# Patient Record
Sex: Female | Born: 1950 | Hispanic: Yes | Smoking: Never smoker
Health system: Southern US, Community
[De-identification: ages and names within clinical notes are randomized; demographics above are authoritative.]

## PROBLEM LIST (undated history)

## (undated) DIAGNOSIS — I10 Essential (primary) hypertension: Secondary | ICD-10-CM

---

## 2018-03-18 ENCOUNTER — Encounter: Payer: Self-pay | Admitting: Emergency Medicine

## 2018-03-18 ENCOUNTER — Emergency Department
Admission: EM | Admit: 2018-03-18 | Discharge: 2018-03-18 | Disposition: A | Discharge status: Home / Self Care | Payer: Self-pay | Attending: Emergency Medicine | Admitting: Emergency Medicine

## 2018-03-18 ENCOUNTER — Other Ambulatory Visit: Payer: Self-pay

## 2018-03-18 DIAGNOSIS — H73892 Other specified disorders of tympanic membrane, left ear: Secondary | ICD-10-CM | POA: Insufficient documentation

## 2018-03-18 DIAGNOSIS — Z79899 Other long term (current) drug therapy: Secondary | ICD-10-CM | POA: Insufficient documentation

## 2018-03-18 DIAGNOSIS — R040 Epistaxis: Secondary | ICD-10-CM

## 2018-03-18 DIAGNOSIS — H6592 Unspecified nonsuppurative otitis media, left ear: Secondary | ICD-10-CM

## 2018-03-18 DIAGNOSIS — I10 Essential (primary) hypertension: Secondary | ICD-10-CM | POA: Insufficient documentation

## 2018-03-18 HISTORY — DX: Essential (primary) hypertension: I10

## 2018-03-18 MED ORDER — CETIRIZINE HCL 10 MG PO CAPS: 10.0000 mg | ORAL_CAPSULE | Freq: Every day | ORAL | 1 refills | Status: AC

## 2018-03-18 NOTE — ED Provider Notes (Signed)
[facility] Emergency Department Provider Note  ____________________________________________  Time seen: Approximately 3:36 PM  I have reviewed the triage vital signs and the nursing notes.   HISTORY  Chief Complaint Epistaxis    HPI Sandra George is a 67 y.o. female presents to the emergency department with a resolved episode of epistaxis that lasted approximately 20 minutes.  Patient had epistaxis from left nare only.  No intranasal medications have been used recently.  Patient denies recreational drug use.  She denies any known history of seasonal allergies but has had left ear discomfort.  Patient reports that she had intermittent episodes of epistaxis as a child but no history as an adult.  Patient denies headache and history of bleeding disorders.  No alleviating measures of been attempted.   Past Medical History:  Diagnosis Date  . Hypertension     There are no active problems to display for this patient.   History reviewed. No pertinent surgical history.  Prior to Admission medications   Medication Sig Start Date End Date Taking? Authorizing Provider  enalapril (VASOTEC) 10 MG tablet Take 10 mg by mouth daily.   Yes [provider]  Cetirizine HCl (ZYRTEC ALLERGY) 10 MG CAPS Take 1 capsule (10 mg total) by mouth daily for 7 doses. 03/18/18 03/25/18  Orvil FeilWoods, Jaclyn M, PA-C    Allergies Patient has no known allergies.  No family history on file.  Social History Social History   Tobacco Use  . Smoking status: Not on file  Substance Use Topics  . Alcohol use: Not on file  . Drug use: Not on file     Review of Systems  Constitutional: No fever/chills Eyes: No visual changes. No discharge ENT: Patient has epistaxis. Patient has left ear discomfort.  Cardiovascular: no chest pain. Respiratory: no cough. No SOB. Gastrointestinal: No abdominal pain.  No nausea, no vomiting.  No diarrhea.  No constipation. Genitourinary: Negative for  dysuria. No hematuria Musculoskeletal: Negative for musculoskeletal pain. Skin: Negative for rash, abrasions, lacerations, ecchymosis. Neurological: Negative for headaches, focal weakness or numbness.   ___________________________________   PHYSICAL EXAM:  VITAL SIGNS: ED Triage Vitals  Enc Vitals Group     BP 03/18/18 1347 (!) 176/92     Pulse Rate 03/18/18 1347 96     Resp 03/18/18 1347 20     Temp 03/18/18 1347 98.4 F (36.9 C)     Temp Source 03/18/18 1347 Oral     SpO2 03/18/18 1347 98 %     Weight 03/18/18 1348 140 lb (63.5 kg)     Height 03/18/18 1348 5' (1.524 m)     Head Circumference --      Peak Flow --      Pain Score 03/18/18 1347 0     Pain Loc --      Pain Edu? --      Excl. in GC? --      Constitutional: Alert and oriented. Well appearing and in no acute distress. Eyes: Conjunctivae are normal. PERRL. EOMI. Head: Atraumatic. ENT:      Ears: Left TM is bulging and effused.  Right TM is mildly effused.      Nose: No congestion/rhinnorhea.  Trace blood at left nare.  Epistaxis is resolved.      Mouth/Throat: Mucous membranes are moist.  Neck: No stridor.  No cervical spine tenderness to palpation. Hematological/Lymphatic/Immunilogical: No cervical lymphadenopathy. Cardiovascular: Normal rate, regular rhythm. Normal S1 and S2.  Good peripheral circulation. Respiratory: Normal respiratory effort  without tachypnea or retractions. Lungs CTAB. Good air entry to the bases with no decreased or absent breath sounds. Skin:  Skin is warm, dry and intact. No rash noted.  ____________________________________________   LABS (all labs ordered are listed, but only abnormal results are displayed)  Labs Reviewed - No data to display ____________________________________________  EKG   ____________________________________________  RADIOLOGY   No results found.  ____________________________________________    PROCEDURES  Procedure(s) performed:     Procedures    Medications - No data to display   ____________________________________________   INITIAL IMPRESSION / ASSESSMENT AND PLAN / ED COURSE  Pertinent labs & imaging results that were available during my care of the patient were reviewed by me and considered in my medical decision making (see chart for details).  Review of the [place] CSRS was performed in accordance of the [facility] prior to dispensing any controlled drugs.      Assessment and plan Epistaxis Middle ear effusion Patient presents to the emergency department with an episode of epistaxis that lasted approximately 20 minutes and spontaneously resolved.  Patient does not have a history of epistaxis and further work-up is not warranted at this time.  Patient was advised to follow-up with primary care regarding hypertension noted at triage.  Patient was discharged with Zyrtec for her left middle ear effusion.  All patient questions were answered.     ____________________________________________  FINAL CLINICAL IMPRESSION(S) / ED DIAGNOSES  Final diagnoses:  Epistaxis  Fluid level behind tympanic membrane of left ear      NEW MEDICATIONS STARTED DURING THIS VISIT:  ED Discharge Orders         Ordered    Cetirizine HCl (ZYRTEC ALLERGY) 10 MG CAPS  Daily     03/18/18 1534              This chart was dictated using voice recognition software/Dragon. Despite best efforts to proofread, errors can occur which can change the meaning. Any change was purely unintentional.    Orvil Feil, PA-C 03/18/18 1543    Sharyn Creamer, MD 03/18/18 [phone]

## 2018-03-18 NOTE — ED Notes (Signed)
Interpretor on ipad used to discuss discharge paperwork.

## 2018-03-18 NOTE — ED Triage Notes (Signed)
Nosebleed for approx prior to arrival. Denies injury. Holding washcloth over nose on arrival. Patient allowed to quit holding nose while in triage. No bleeding noted currently.

## 2018-03-18 NOTE — ED Notes (Signed)
See triage note  Developed nosebleed about 30 mins PTA w/o injury

## 2018-03-18 NOTE — Progress Notes (Signed)
Chaplain responded to a medical emergency at  the visitors entrance. Pt son was with Pt. Chaplain escorted son with Pt to triage. Chaplain let son know chaplain will be available if needed    03/18/18 1300  Clinical Encounter Type  Visited With Patient and family together  Visit Type Initial  Referral From Nurse  Spiritual Encounters  Spiritual Needs Emotional

## 2019-06-18 ENCOUNTER — Other Ambulatory Visit: Payer: Self-pay

## 2019-06-18 ENCOUNTER — Encounter: Payer: Self-pay | Admitting: Emergency Medicine

## 2019-06-18 ENCOUNTER — Emergency Department: Payer: Self-pay

## 2019-06-18 ENCOUNTER — Emergency Department
Admission: EM | Admit: 2019-06-18 | Discharge: 2019-06-19 | Disposition: A | Discharge status: Home / Self Care | Payer: Self-pay | Attending: Emergency Medicine | Admitting: Emergency Medicine

## 2019-06-18 DIAGNOSIS — R42 Dizziness and giddiness: Secondary | ICD-10-CM | POA: Insufficient documentation

## 2019-06-18 DIAGNOSIS — I10 Essential (primary) hypertension: Secondary | ICD-10-CM | POA: Insufficient documentation

## 2019-06-18 DIAGNOSIS — Z79899 Other long term (current) drug therapy: Secondary | ICD-10-CM | POA: Insufficient documentation

## 2019-06-18 LAB — URINALYSIS, COMPLETE (UACMP) WITH MICROSCOPIC
Bacteria, UA: NONE SEEN
Bilirubin Urine: NEGATIVE
Glucose, UA: NEGATIVE mg/dL
Hgb urine dipstick: NEGATIVE
Ketones, ur: NEGATIVE mg/dL
Nitrite: NEGATIVE
Protein, ur: NEGATIVE mg/dL
Specific Gravity, Urine: 1.012 (ref 1.005–1.030)
pH: 5 (ref 5.0–8.0)

## 2019-06-18 LAB — CBC
HCT: 39.7 % (ref 36.0–46.0)
Hemoglobin: 14.1 g/dL (ref 12.0–15.0)
MCH: 30.9 pg (ref 26.0–34.0)
MCHC: 35.5 g/dL (ref 30.0–36.0)
MCV: 86.9 fL (ref 80.0–100.0)
Platelets: 125 10*3/uL — ABNORMAL LOW (ref 150–400)
RBC: 4.57 MIL/uL (ref 3.87–5.11)
RDW: 12.1 % (ref 11.5–15.5)
WBC: 7.3 10*3/uL (ref 4.0–10.5)
nRBC: 0 % (ref 0.0–0.2)

## 2019-06-18 LAB — BASIC METABOLIC PANEL
Anion gap: 15 (ref 5–15)
BUN: 20 mg/dL (ref 8–23)
CO2: 22 mmol/L (ref 22–32)
Calcium: 9.7 mg/dL (ref 8.9–10.3)
Chloride: 102 mmol/L (ref 98–111)
Creatinine, Ser: 1.38 mg/dL — ABNORMAL HIGH (ref 0.44–1.00)
GFR calc Af Amer: 46 mL/min — ABNORMAL LOW (ref >60)
GFR calc non Af Amer: 40 mL/min — ABNORMAL LOW (ref >60)
Glucose, Bld: 155 mg/dL — ABNORMAL HIGH (ref 70–99)
Potassium: 3 mmol/L — ABNORMAL LOW (ref 3.5–5.1)
Sodium: 139 mmol/L (ref 135–145)

## 2019-06-18 LAB — TROPONIN I (HIGH SENSITIVITY): Troponin I (High Sensitivity): 3 ng/L (ref <18)

## 2019-06-18 MED ORDER — POTASSIUM CHLORIDE CRYS ER 20 MEQ PO TBCR
40.0000 meq | EXTENDED_RELEASE_TABLET | Freq: Once | ORAL | Status: AC
  Administered 2019-06-19: 40 meq via ORAL
  Filled 2019-06-18: qty 2

## 2019-06-18 MED ORDER — MECLIZINE HCL 25 MG PO TABS
25.0000 mg | ORAL_TABLET | Freq: Once | ORAL | Status: AC
  Administered 2019-06-19: 25 mg via ORAL
  Filled 2019-06-18: qty 1

## 2019-06-18 NOTE — ED Triage Notes (Signed)
Pt to ED via POV stating that she has been having an issue with her blood pressure running high. Pt states that she has also had headache, dizziness, and nausea. Pt states that her PCP recently changed her blood pressure medication and also started her on a muscle relaxant. Pt is in NAD at this time.

## 2019-06-18 NOTE — ED Provider Notes (Signed)
[facility] Emergency Department Provider Note   ____________________________________________   I have reviewed the triage vital signs and the nursing notes.   HISTORY  Chief Complaint Hypertension and Dizziness   History limited by: Not Limited   HPI Sandra George is a 68 y.o. female who presents to the emergency department today because of concerns for headache high blood pressure and dizziness.  The patient has been having issues with blood pressure control and has seen her primary care physician for this.  She did have her medication switched a few days ago.  Since the switch her blood pressure has continued to be elevated.  This has been associated with some headache.  She does states she has had a headache for about a year that will come and go.  Is located primarily in the right side in the back of her head.  For the past couple of days the headache has been slightly worse and has been accompanied by dizziness.  She does feel the dizziness worse when she moves. The patient denies similar dizziness in the past.    Records reviewed. Per medical record review patient has a history of hypertension.   Past Medical History:  Diagnosis Date  . Hypertension     There are no problems to display for this patient.   History reviewed. No pertinent surgical history.  Prior to Admission medications   Medication Sig Start Date End Date Taking? Authorizing Provider  Cetirizine HCl (ZYRTEC ALLERGY) 10 MG CAPS Take 1 capsule (10 mg total) by mouth daily for 7 doses. 03/18/18 03/25/18  Lannie Fields, PA-C  enalapril (VASOTEC) 10 MG tablet Take 10 mg by mouth daily.    [provider]    Allergies Patient has no known allergies.  No family history on file.  Social History Social History   Tobacco Use  . Smoking status: Never Smoker  . Smokeless tobacco: Never Used  Substance Use Topics  . Alcohol use: Not Currently  . Drug use: Not Currently     Review of Systems Constitutional: No fever/chills Eyes: No visual changes. ENT: No sore throat. Cardiovascular: Denies chest pain. Respiratory: Denies shortness of breath. Gastrointestinal: No abdominal pain.  No nausea, no vomiting.  No diarrhea.   Genitourinary: Negative for dysuria. Musculoskeletal: Negative for back pain. Skin: Negative for rash. Neurological: Positive for headache and dizziness.  ____________________________________________   PHYSICAL EXAM:  VITAL SIGNS: ED Triage Vitals  Enc Vitals Group     BP 06/18/19 1818 (!) 160/99     Pulse Rate 06/18/19 1818 (!) 101     Resp 06/18/19 1818 16     Temp 06/18/19 1818 98.3 F (36.8 C)     Temp Source 06/18/19 1818 Oral     SpO2 06/18/19 1818 100 %     Weight 06/18/19 1820 145 lb (65.8 kg)     Height 06/18/19 1820 5' (1.524 m)     Head Circumference --      Peak Flow --      Pain Score 06/18/19 1818 3   Constitutional: Alert and oriented.  Eyes: Conjunctivae are normal.  ENT      Head: Normocephalic and atraumatic.      Nose: No congestion/rhinnorhea.      Mouth/Throat: Mucous membranes are moist.      Neck: No stridor. Hematological/Lymphatic/Immunilogical: No cervical lymphadenopathy. Cardiovascular: Normal rate, regular rhythm.  No murmurs, rubs, or gallops.  Respiratory: Normal respiratory effort without tachypnea nor retractions. Breath sounds are  clear and equal bilaterally. No wheezes/rales/rhonchi. Gastrointestinal: Soft and non tender. No rebound. No guarding.  Genitourinary: Deferred Musculoskeletal: Normal range of motion in all extremities. No lower extremity edema. Neurologic:  Normal speech and language. No gross focal neurologic deficits are appreciated. Dizziness elicited by sitting the patient up suddenly.  Skin:  Skin is warm, dry and intact. No rash noted. Psychiatric: Mood and affect are normal. Speech and behavior are normal. Patient exhibits appropriate insight and  judgment.  ____________________________________________    LABS (pertinent positives/negatives)  Trop hs 3 BMP wnl except k 3.0, glu 155, cr 1.38 CBC wbc 7.3, hgb 14.1, plt 125  ____________________________________________   EKG  I, Phineas Semen, attending physician, personally viewed and interpreted this EKG  EKG Time: 1824 Rate: 99 Rhythm: normal sinus rhythm Axis: left axis deviation Intervals: qtc 472 QRS: narrow, q waves III ST changes: no st elevation Impression: abnormal ekg  ____________________________________________    RADIOLOGY  CT head Chronic changes without acute abnormality  ____________________________________________   PROCEDURES  Procedures  ____________________________________________   INITIAL IMPRESSION / ASSESSMENT AND PLAN / ED COURSE  Pertinent labs & imaging results that were available during my care of the patient were reviewed by me and considered in my medical decision making (see chart for details).   Patient presented to the emergency department today because of concerns for some headache, high blood pressure and dizziness.  Head CT without any acute abnormality.  Patient's dizziness did seem to be somewhat positional.  I do wonder if patient is suffering from vertigo. Will try meclizine. Patient's potassium was also slightly low so will give potassium here in the emergency department.   ____________________________________________   FINAL CLINICAL IMPRESSION(S) / ED DIAGNOSES  Final diagnoses:  Vertigo  Hypertension, unspecified type     Note: This dictation was prepared with Dragon dictation. Any transcriptional errors that result from this process are unintentional     Phineas Semen, MD 06/19/19 1520

## 2019-06-19 LAB — TROPONIN I (HIGH SENSITIVITY): Troponin I (High Sensitivity): 4 ng/L (ref <18)

## 2019-06-19 MED ORDER — SODIUM CHLORIDE 0.9 % IV BOLUS
500.0000 mL | Freq: Once | INTRAVENOUS | Status: AC
  Administered 2019-06-19: 500 mL via INTRAVENOUS

## 2019-06-19 MED ORDER — SODIUM CHLORIDE 0.9 % IV BOLUS: 1000.0000 mL | Freq: Once | INTRAVENOUS | Status: DC

## 2019-06-19 MED ORDER — MECLIZINE HCL 25 MG PO TABS: 25.0000 mg | ORAL_TABLET | Freq: Three times a day (TID) | ORAL | 0 refills | Status: AC | PRN

## 2019-06-19 NOTE — ED Notes (Signed)
Peripheral IV discontinued. Catheter intact. No signs of infiltration or redness. Gauze applied to IV site.    Discharge instructions reviewed with patient. Questions fielded by this RN. Patient verbalizes understanding of instructions. Patient discharged home in stable condition per [facility]man. No acute distress noted at time of discharge.    

## 2019-06-19 NOTE — ED Notes (Signed)
With interpreter, [place], [place], pt with daughter reports mechanical fall yesterday and has been feeling badly since, pt takes 2 x HTN meds and new med recently added, dizziness and malaise.  Pt appears calm and relaxed, no pain att and CMS intact to extremities, A & O x 4

## 2019-06-20 LAB — URINE CULTURE

## 2020-11-09 IMAGING — CT CT HEAD W/O CM
3 series · 16 of 47 positions shown, 19 images · non-contrast
Comparison: None.

CLINICAL DATA: Hypertension and headaches

EXAM:
CT HEAD WITHOUT CONTRAST
TECHNIQUE: Contiguous axial images were obtained from the base of the skull
through the vertex without intravenous contrast.

[Series 2: head wo · axial · 0.41mm/px · z∈[+284,+409]mm · 10 of 30 slices shown, 13 images]
[im 3/30  brain]
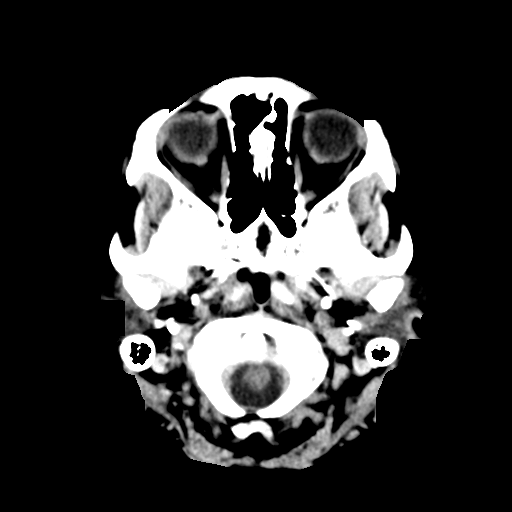
[im 3/30  bone]
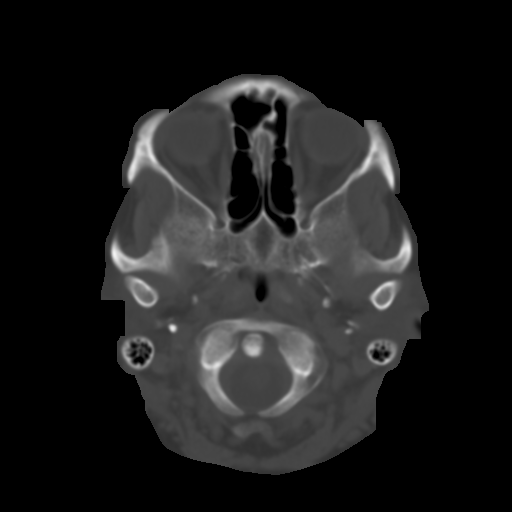
[im 6/30  brain]
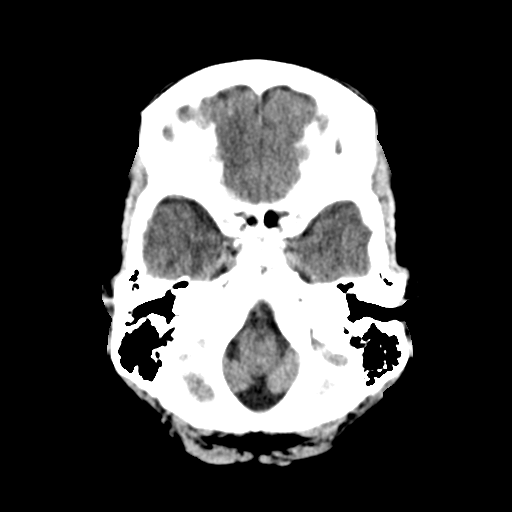
[im 9/30  brain]
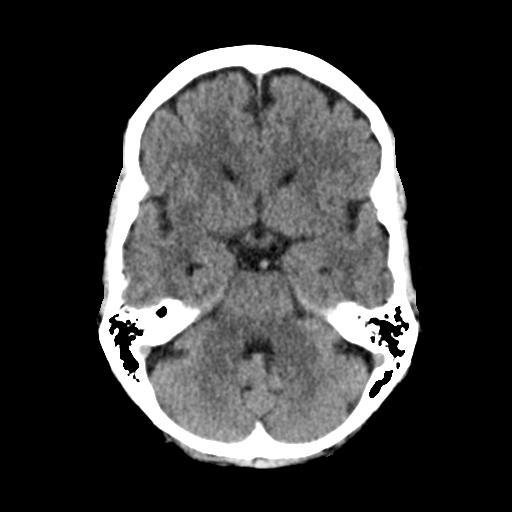
[im 11/30  brain]
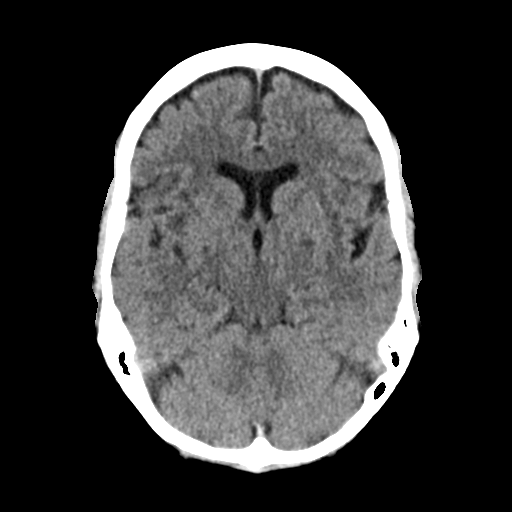
[im 14/30  brain]
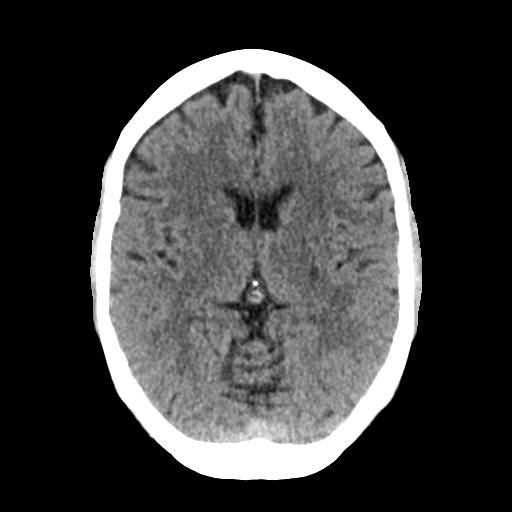
[im 14/30  bone]
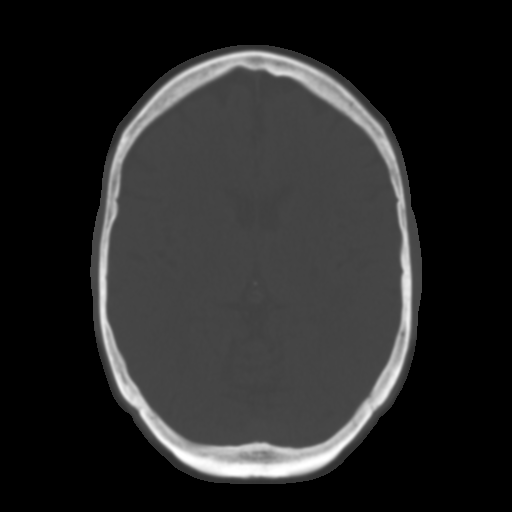
[im 17/30  brain]
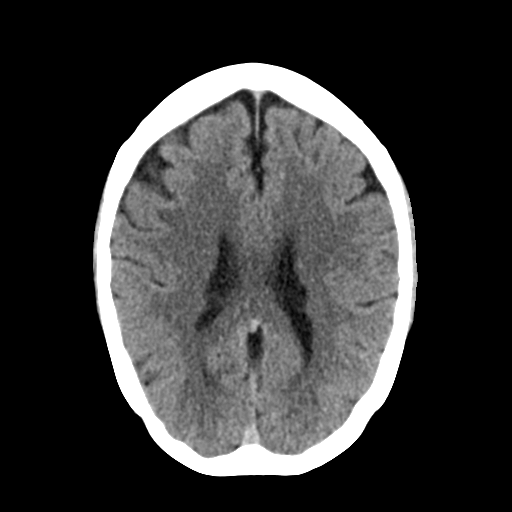
[im 20/30  brain]
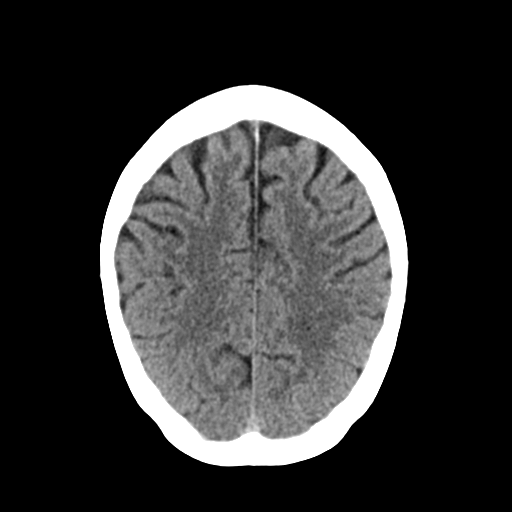
[im 23/30  brain]
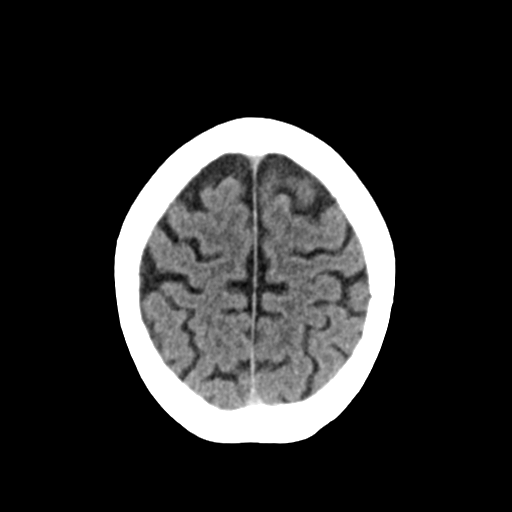
[im 25/30  brain]
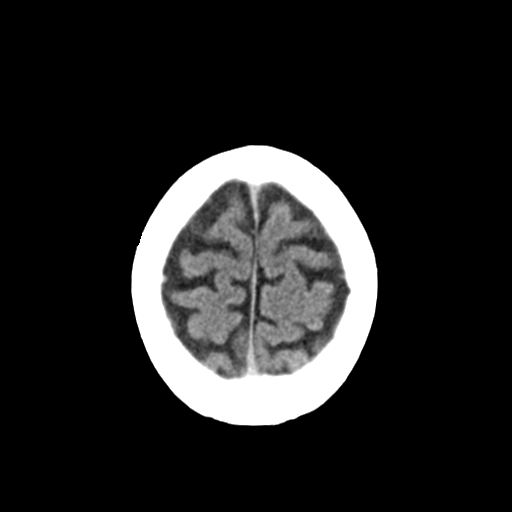
[im 25/30  bone]
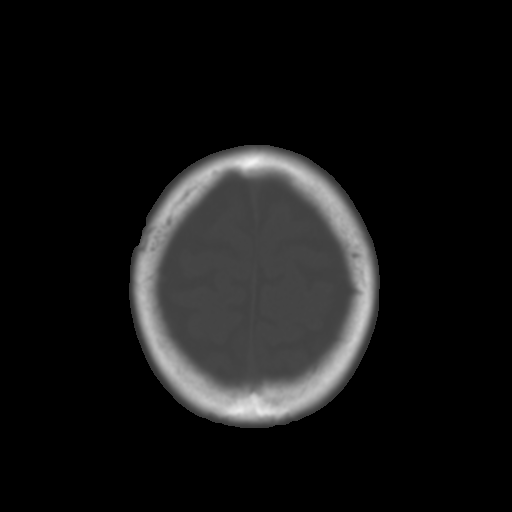
[im 28/30  brain]
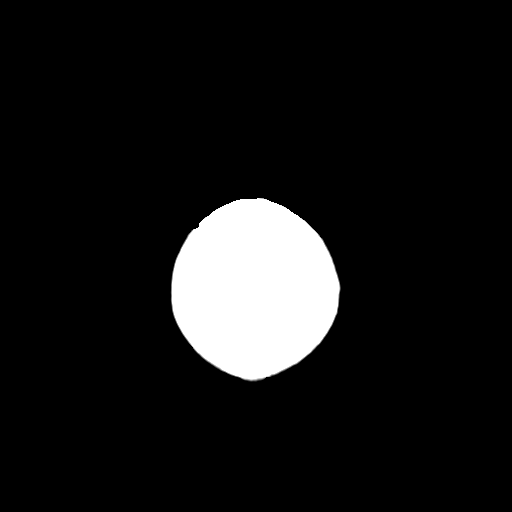

[Series 4: coronal soft tissue · coronal · 0.29mm/px · 3 of 60 slices shown]
[im 20/60  brain]
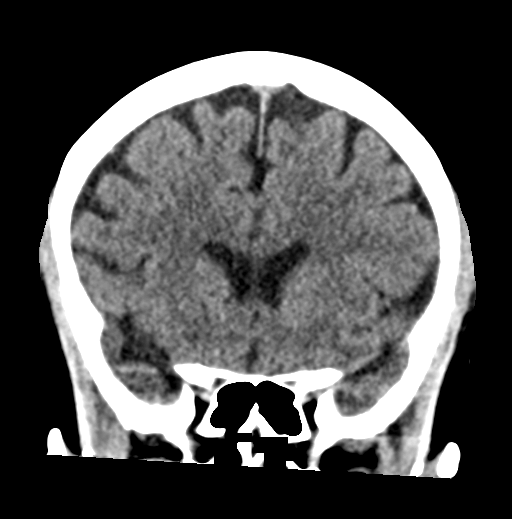
[im 27/60  brain]
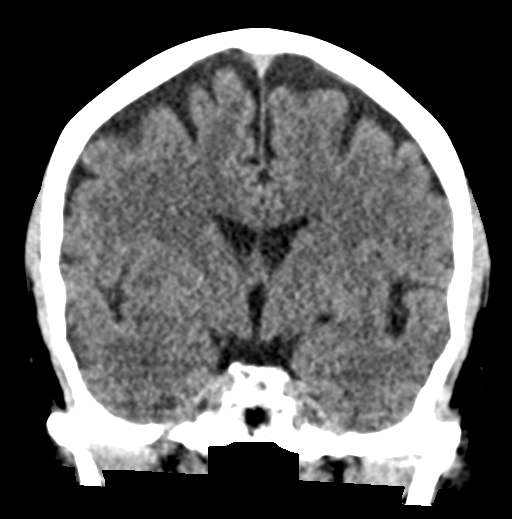
[im 33/60  brain]
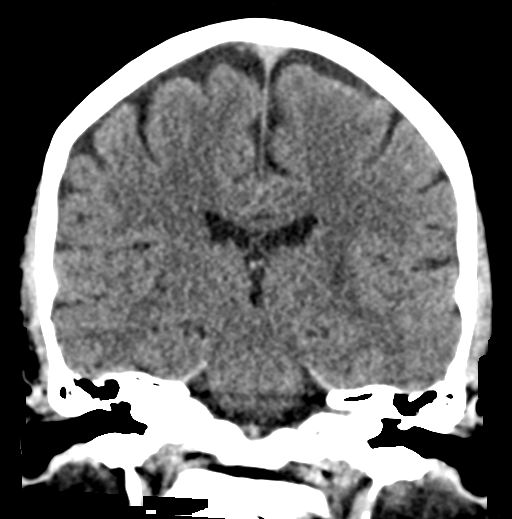

[Series 5: sagittal soft tissue · sagittal · 0.33mm/px · 3 of 46 slices shown]
[im 16/46  brain]
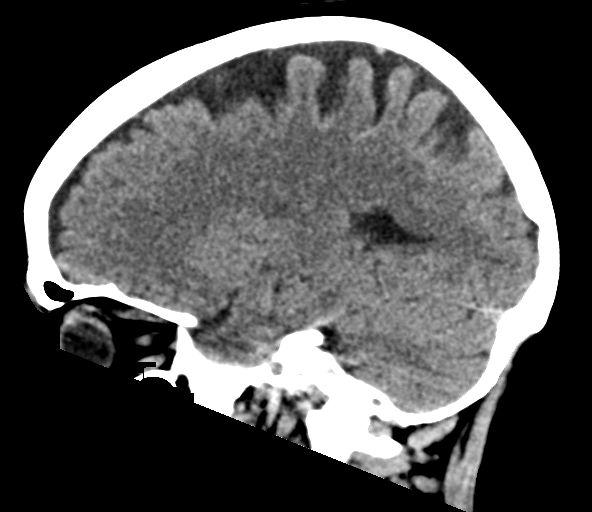
[im 23/46  brain]
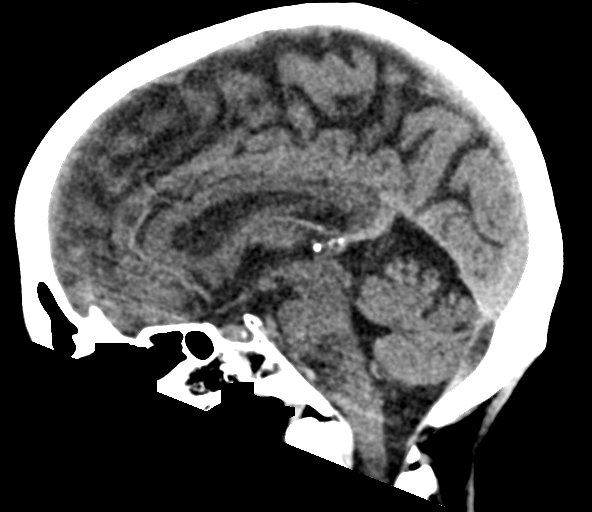
[im 31/46  brain]
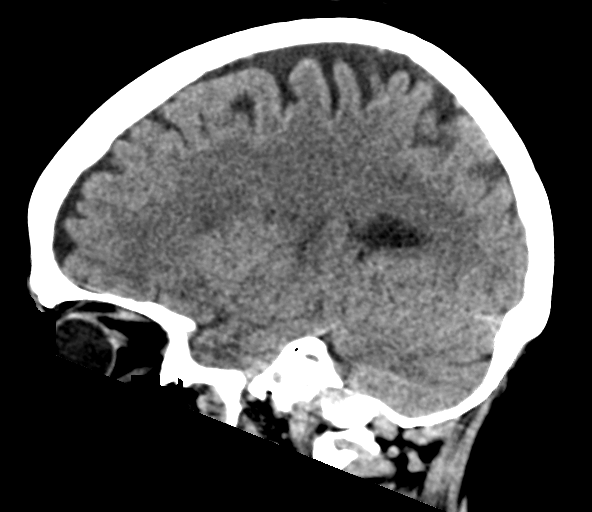

[16 of 47 positions shown; findings below may reference images not displayed]

FINDINGS: Brain: No findings to suggest acute hemorrhage, acute infarction or
space-occupying mass lesion are seen. There is a small lacunar
infarct on the left in the deep white matter just above the basal
ganglia.

Vascular: No hyperdense vessel or unexpected calcification.

Skull: Normal. Negative for fracture or focal lesion.

Sinuses/Orbits: No acute finding.

Other: None.
IMPRESSION: Chronic changes without acute abnormality.
